# Patient Record
Sex: Male | Born: 1990 | Race: Black or African American | Hispanic: No | Marital: Single
Health system: Southern US, Community
[De-identification: ages and names within clinical notes are randomized; demographics above are authoritative.]

---

## 2019-04-28 ENCOUNTER — Other Ambulatory Visit (HOSPITAL_COMMUNITY): Payer: Self-pay | Admitting: Surgery

## 2019-04-28 ENCOUNTER — Emergency Department (HOSPITAL_COMMUNITY): Payer: Self-pay

## 2019-04-28 ENCOUNTER — Other Ambulatory Visit: Payer: Self-pay

## 2019-04-28 ENCOUNTER — Encounter (HOSPITAL_COMMUNITY): Payer: Self-pay | Admitting: Emergency Medicine

## 2019-04-28 ENCOUNTER — Emergency Department (HOSPITAL_COMMUNITY)
Admission: EM | Admit: 2019-04-28 | Discharge: 2019-04-28 | Disposition: A | Discharge status: Home / Self Care | Payer: Self-pay | Attending: Surgery | Admitting: Surgery

## 2019-04-28 DIAGNOSIS — Z20828 Contact with and (suspected) exposure to other viral communicable diseases: Secondary | ICD-10-CM | POA: Insufficient documentation

## 2019-04-28 DIAGNOSIS — K8020 Calculus of gallbladder without cholecystitis without obstruction: Secondary | ICD-10-CM | POA: Insufficient documentation

## 2019-04-28 DIAGNOSIS — K819 Cholecystitis, unspecified: Secondary | ICD-10-CM

## 2019-04-28 LAB — CBC
HCT: 46.4 % (ref 39.0–52.0)
Hemoglobin: 15.4 g/dL (ref 13.0–17.0)
MCH: 28.9 pg (ref 26.0–34.0)
MCHC: 33.2 g/dL (ref 30.0–36.0)
MCV: 87.1 fL (ref 80.0–100.0)
Platelets: 241 10*3/uL (ref 150–400)
RBC: 5.33 MIL/uL (ref 4.22–5.81)
RDW: 13.4 % (ref 11.5–15.5)
WBC: 6.5 10*3/uL (ref 4.0–10.5)
nRBC: 0 % (ref 0.0–0.2)

## 2019-04-28 LAB — COMPREHENSIVE METABOLIC PANEL
ALT: 20 U/L (ref 0–44)
AST: 19 U/L (ref 15–41)
Albumin: 4.3 g/dL (ref 3.5–5.0)
Alkaline Phosphatase: 50 U/L (ref 38–126)
Anion gap: 9 (ref 5–15)
BUN: 10 mg/dL (ref 6–20)
CO2: 29 mmol/L (ref 22–32)
Calcium: 10 mg/dL (ref 8.9–10.3)
Chloride: 99 mmol/L (ref 98–111)
Creatinine, Ser: 0.83 mg/dL (ref 0.61–1.24)
GFR calc Af Amer: >60 mL/min (ref >60)
GFR calc non Af Amer: >60 mL/min (ref >60)
Glucose, Bld: 104 mg/dL — ABNORMAL HIGH (ref 70–99)
Potassium: 4.1 mmol/L (ref 3.5–5.1)
Sodium: 137 mmol/L (ref 135–145)
Total Bilirubin: 0.8 mg/dL (ref 0.3–1.2)
Total Protein: 8.2 g/dL — ABNORMAL HIGH (ref 6.5–8.1)

## 2019-04-28 LAB — SARS CORONAVIRUS 2 BY RT PCR (HOSPITAL ORDER, PERFORMED IN ~~LOC~~ HOSPITAL LAB): SARS Coronavirus 2: NEGATIVE

## 2019-04-28 LAB — URINALYSIS, ROUTINE W REFLEX MICROSCOPIC
Bacteria, UA: NONE SEEN
Bilirubin Urine: NEGATIVE
Glucose, UA: NEGATIVE mg/dL
Hgb urine dipstick: NEGATIVE
Ketones, ur: 20 mg/dL — AB
Leukocytes,Ua: NEGATIVE
Nitrite: NEGATIVE
Protein, ur: 30 mg/dL — AB
Specific Gravity, Urine: 1.027 (ref 1.005–1.030)
pH: 5 (ref 5.0–8.0)

## 2019-04-28 LAB — LIPASE, BLOOD: Lipase: 23 U/L (ref 11–51)

## 2019-04-28 MED ORDER — SODIUM CHLORIDE 0.9 % IV SOLN
INTRAVENOUS | Status: DC
  Administered 2019-04-28: 20:00:00 via INTRAVENOUS

## 2019-04-28 MED ORDER — IOHEXOL 300 MG/ML  SOLN
100.0000 mL | Freq: Once | INTRAMUSCULAR | Status: AC | PRN
  Administered 2019-04-28: 100 mL via INTRAVENOUS

## 2019-04-28 MED ORDER — SODIUM CHLORIDE (PF) 0.9 % IJ SOLN
INTRAMUSCULAR | Status: AC
  Administered 2019-04-28: 20:00:00
  Filled 2019-04-28: qty 50

## 2019-04-28 MED ORDER — HYDROCODONE-ACETAMINOPHEN 5-325 MG PO TABS: 1.0000 | ORAL_TABLET | Freq: Four times a day (QID) | ORAL | 0 refills | Status: AC | PRN

## 2019-04-28 NOTE — ED Triage Notes (Signed)
Pt c/o RLQ pains since Sunday. Reports pains aren't as bad as they were then. Reports that pain is worse with movement.

## 2019-04-28 NOTE — Discharge Instructions (Signed)
If pain worsens or you start running a fever and cannot hold anything down please return to the emergency room

## 2019-04-28 NOTE — ED Notes (Signed)
Unsuccessful IV attempt x2.  

## 2019-04-28 NOTE — H&P (Signed)
Re:   Fiore Detjen DOB:   [birth year] MRN:   [id number]  Chief Complaint Abdominal pain  ASSESEMENT AND PLAN: 1.  Cholecystis, cholelithiasis  I discussed with the patient the indications and risks of gall bladder surgery.  The primary risks of gall bladder surgery include, but are not limited to, bleeding, infection, common bile duct injury, and open surgery.  There is also the risk that the patient may have continued symptoms after surgery.  We discussed the typical post-operative recovery course. I tried to answer the patient's questions.  His symptoms are mild, his WBC normal.  I got his mother on the phone.  I offered to keep him in the hospital with plans for gall bladder surgery tomorrow or to go home and contact our office about an elective cholecystectomy in 2 to 4 weeks.  His mother much preferred he go home tonight for an elective operation.  He'll contact our office tomorrow to schedule surgery.   Chief Complaint  Patient presents with  . Abdominal Pain  . sent from [facility]   PHYSICIAN REQUESTING CONSULTATION:  Dr. Deanna Artis, [facility]  HISTORY OF PRESENT ILLNESS: Anddy Wingert is a 28 y.o. (DOB: [birth year])  AA male whose primary care physician is Patient, No Pcp Per. He is by himself.   Mr. Roseboom presents with a 4 day history of RUQ pain. This pain started Sunday, 9/27.  He had a lot of RUQ pain and vomited.  It has gotten better during the week, but still bothers him some.  He has been afebrile and had no more vomiting.  He has no history of stomach disease, liver disease, or colon disease.  He has had no prior surgery.  His mother's mother had gall bladder disease.  CT scan of the abdomen - 04/28/2019 - 1. Diffuse gallbladder wall thickening and edema as well as small amount of free fluid within the pelvis. These findings are likely related to underlying liver or systemic disease. Clinical correlation is recommended. No calcified gallstone identified. Ultrasound may  provide better evaluation of the gallbladder.  2. No bowel obstruction or active inflammation. Normal appendix.  US of the abdomen - 04/28/2019 - Cholelithiasis with equivocal findings for acute cholecystitis.  He has multiple large stones in the gall bladder.  WBC - 6,500 - 04/28/2019 LFT's okay - 04/28/2019   History reviewed. No pertinent past medical history.   History reviewed. No pertinent surgical history.    Current Facility-Administered Medications  Medication Dose Route Frequency Provider Last Rate Last Dose  . 0.9 %  sodium chloride infusion   Intravenous Continuous Lorre Nick, MD 20 mL/hr at 04/28/19 2025     Current Outpatient Medications  Medication Sig Dispense Refill  . acetaminophen (TYLENOL) 325 MG tablet Take 650 mg by mouth daily as needed for moderate pain.       No Known Allergies  REVIEW OF SYSTEMS: Skin:  No history of rash.  No history of abnormal moles. Infection:  No history of hepatitis or HIV.  No history of MRSA. Neurologic:  No history of stroke.  No history of seizure.  No history of headaches. Cardiac:  No history of hypertension. No history of heart disease.  No history of prior cardiac catheterization.  No history of seeing a cardiologist. Pulmonary:  Does not smoke cigarettes.  No asthma or bronchitis.  No OSA/CPAP.  Endocrine:  No diabetes. No thyroid disease. Gastrointestinal:   See HPI Urologic:  No history of kidney stones.  No history of  bladder infections. Musculoskeletal:  No history of joint or back disease. Hematologic:  No bleeding disorder.  No history of anemia.  Not anticoagulated. Psycho-social:  The patient is oriented.   The patient has no obvious psychologic or social impairment to understanding our conversation and plan.  SOCIAL and FAMILY HISTORY: Unmarried. Lives at home with his parents.  I spoke to his mother, Levis Nazir, on the phone.      He is unemplyed for 2 years.  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  His grandmother had gall bladder disease.  PHYSICAL EXAM: BP 108/78   Pulse 99   Temp 98.4 F (36.9 C) (Oral)   Resp 17   SpO2 100%   General: WN AA M who is alert and generally healthy appearing.  Skin:  Inspection and palpation - no mass or rash. Eyes:  Conjunctiva and lids unremarkable.            Pupils are equal Ears, Nose, Mouth, and Throat:  Ears and nose unremarkable            Lips and teeth are unremarable. Neck: Supple. No mass, trachea midline.  No thyroid mass. Lymph Nodes:  No supraclavicular, cervical, or inguinal nodes. Lungs: Normal respiratory effort.  Clear to auscultation and symmetric breath sounds. Heart:  Palpation of the heart is normal.            Auscultation: RRR. No murmur or rub.  Abdomen: Soft. No mass.  No hernia.  Normal bowel sounds.  No abdominal scars.   Minimal RUQ soreness.  No guarding or peritoneal signs. Rectal: Not done. Musculoskeletal:  Good muscle strength and ROM  in upper and lower extremities.  Neurologic:  Grossly intact to motor and sensory function. Psychiatric: Normal judgement and insight. Behavior is normal.            Oriented to time, person, place.   DATA REVIEWED, COUNSELING AND COORDINATION OF CARE: Epic notes reviewed. Counseling and coordination of care exceeded more than 50% of the time spent with patient. Total time spent with patient and charting: 45 minutes  Alphonsa Overall, MD,  [facility], [address]  [place], [place]    [place] Phone:  [phone] FAX:  [phone]

## 2019-04-28 NOTE — ED Provider Notes (Signed)
[facility]-EMERGENCY DEPT Provider Note   CSN: [id number] Arrival date & time: 04/28/19  1351     History   Chief Complaint Chief Complaint  Patient presents with  . Abdominal Pain  . sent from [facility]    HPI Javyn Havlin is a 28 y.o. male.     28 year old male presents with almost a week of right upper quadrant abdominal pain.  No associated fever or chills.  No urinary symptoms.  Denies any testicular pain or swelling.  Went to urgent care center and was sent here for evaluation of possible pollicized.  He describes the pain is constant and achy.  No association with taking deep breath.  No cough or shortness of breath.  No associated with food either.  No prior history of same.  No treatment use prior to arrival.     History reviewed. No pertinent past medical history.  There are no active problems to display for this patient.   History reviewed. No pertinent surgical history.      Home Medications    Prior to Admission medications   Not on File    Family History No family history on file.  Social History Social History   Tobacco Use  . Smoking status: Not on file  Substance Use Topics  . Alcohol use: Not on file  . Drug use: Not on file     Allergies   Patient has no known allergies.   Review of Systems Review of Systems  All other systems reviewed and are negative.    Physical Exam Updated Vital Signs BP 121/82 (BP Location: Right Arm)   Pulse 82   Temp 98.4 F (36.9 C) (Oral)   Resp 17   SpO2 99%   Physical Exam Vitals signs and nursing note reviewed.  Constitutional:      General: He is not in acute distress.    Appearance: Normal appearance. He is well-developed. He is not toxic-appearing.  HENT:     Head: Normocephalic and atraumatic.  Eyes:     General: Lids are normal.     Conjunctiva/sclera: Conjunctivae normal.     Pupils: Pupils are equal, round, and reactive to light.  Neck:     Musculoskeletal:  Normal range of motion and neck supple.     Thyroid: No thyroid mass.     Trachea: No tracheal deviation.  Cardiovascular:     Rate and Rhythm: Normal rate and regular rhythm.     Heart sounds: Normal heart sounds. No murmur. No gallop.   Pulmonary:     Effort: Pulmonary effort is normal. No respiratory distress.     Breath sounds: Normal breath sounds. No stridor. No decreased breath sounds, wheezing, rhonchi or rales.  Abdominal:     General: Bowel sounds are normal. There is no distension.     Palpations: Abdomen is soft.     Tenderness: There is abdominal tenderness in the right upper quadrant. There is no guarding or rebound.    Musculoskeletal: Normal range of motion.        General: No tenderness.  Skin:    General: Skin is warm and dry.     Findings: No abrasion or rash.  Neurological:     Mental Status: He is alert and oriented to person, place, and time.     GCS: GCS eye subscore is 4. GCS verbal subscore is 5. GCS motor subscore is 6.     Cranial Nerves: No cranial nerve deficit.  Sensory: No sensory deficit.  Psychiatric:        Speech: Speech normal.        Behavior: Behavior normal.      ED Treatments / Results  Labs (all labs ordered are listed, but only abnormal results are displayed) Labs Reviewed  COMPREHENSIVE METABOLIC PANEL - Abnormal; Notable for the following components:      Result Value   Glucose, Bld 104 (*)    Total Protein 8.2 (*)    All other components within normal limits  LIPASE, BLOOD  CBC  URINALYSIS, ROUTINE W REFLEX MICROSCOPIC    EKG None  Radiology No results found.  Procedures Procedures (including critical care time)  Medications Ordered in ED Medications  0.9 %  sodium chloride infusion (has no administration in time range)     Initial Impression / Assessment and Plan / ED Course  I have reviewed the triage vital signs and the nursing notes.  Pertinent labs & imaging results that were available during my care  of the patient were reviewed by me and considered in my medical decision making (see chart for details).        Patient is abdominal CT suspicious for cholecystitis.  This was confirmed by ultrasound.  Discussed with Dr. Lucia Gaskins from general surgery will come and see the patient  Final Clinical Impressions(s) / ED Diagnoses   Final diagnoses:  None    ED Discharge Orders    None       Lacretia Leigh, MD 04/28/19 2155

## 2021-03-19 IMAGING — US US ABDOMEN LIMITED
1 series · 14 of 25 positions shown · non-contrast
Comparison: CT of the abdomen pelvis dated 04/28/2019

CLINICAL DATA: 27-year-old male with right upper quadrant abdominal
pain.

EXAM:
ULTRASOUND ABDOMEN LIMITED RIGHT UPPER QUADRANT

[Series 1: us abdomen limited · 14 of 53 slices shown]
[im 1/53]
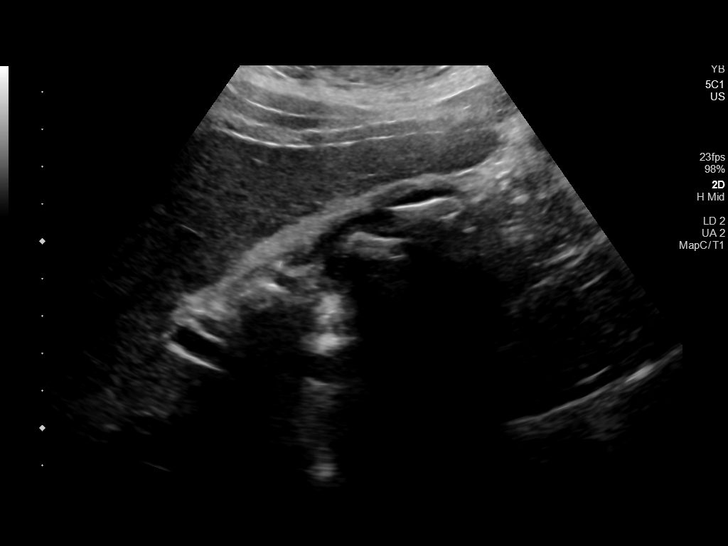
[im 5/53]
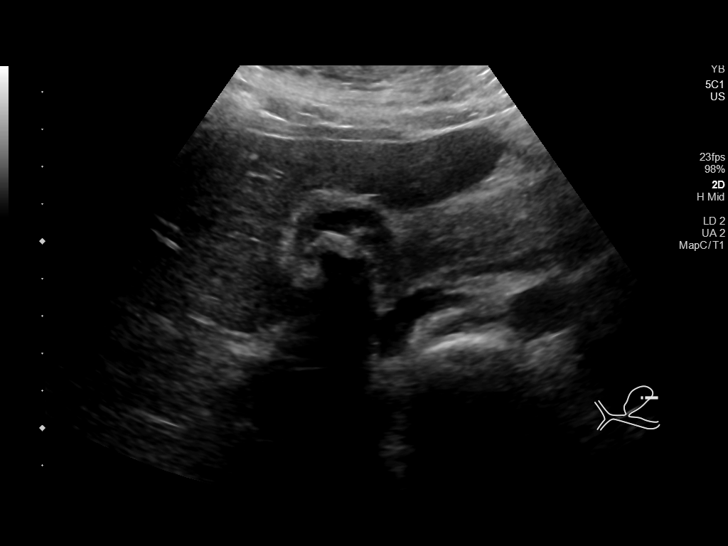
[im 9/53]
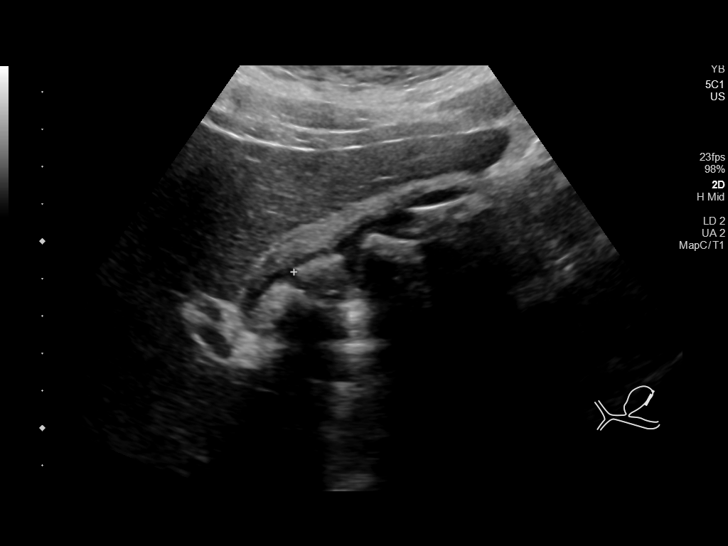
[im 14/53]
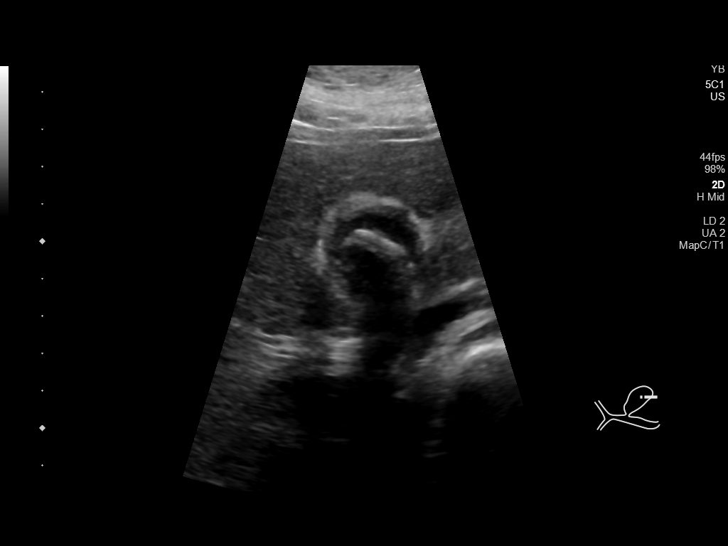
[im 18/53]
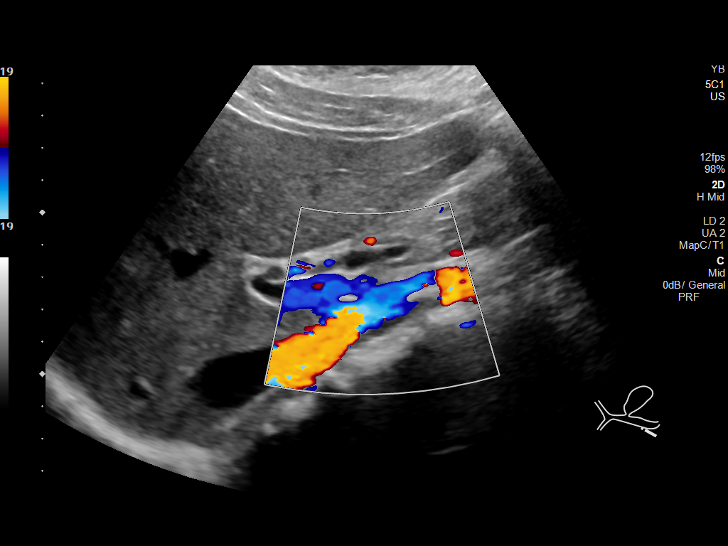
[im 20/53]
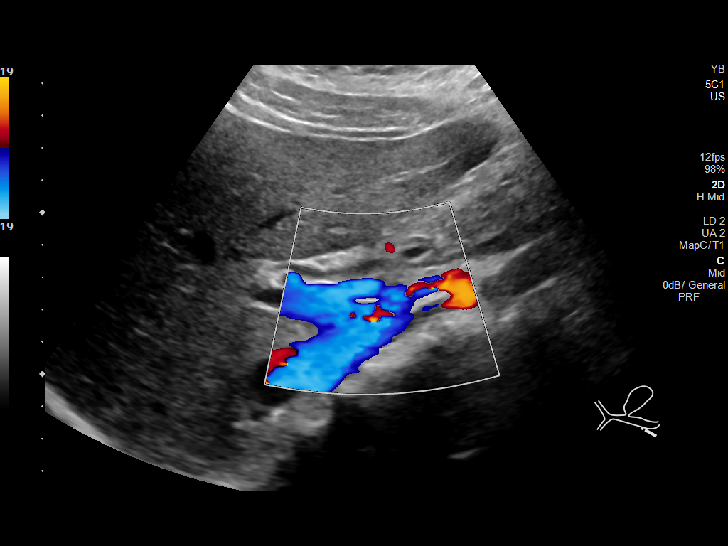
[im 24/53]
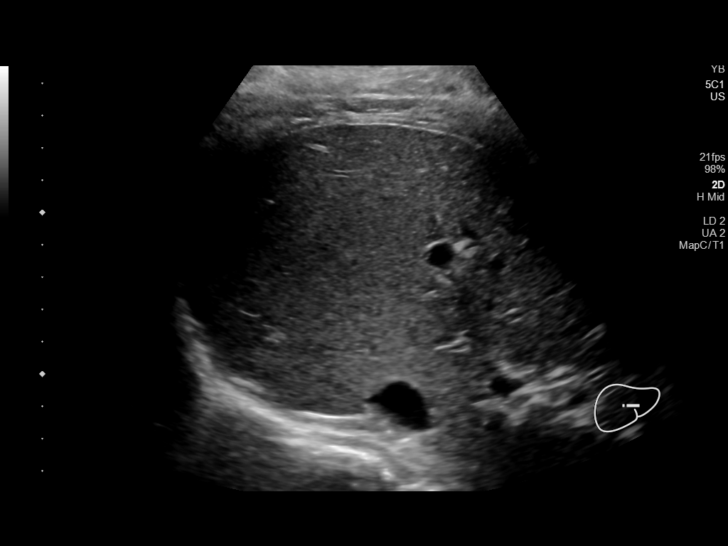
[im 29/53]
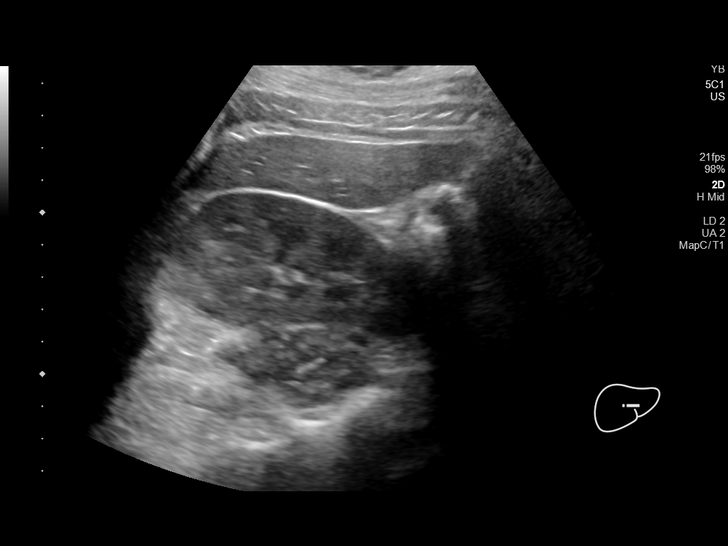
[im 33/53]
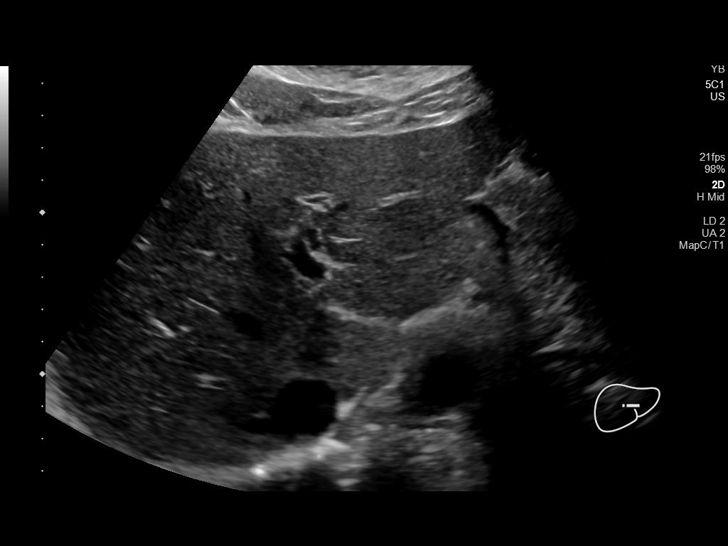
[im 35/53]
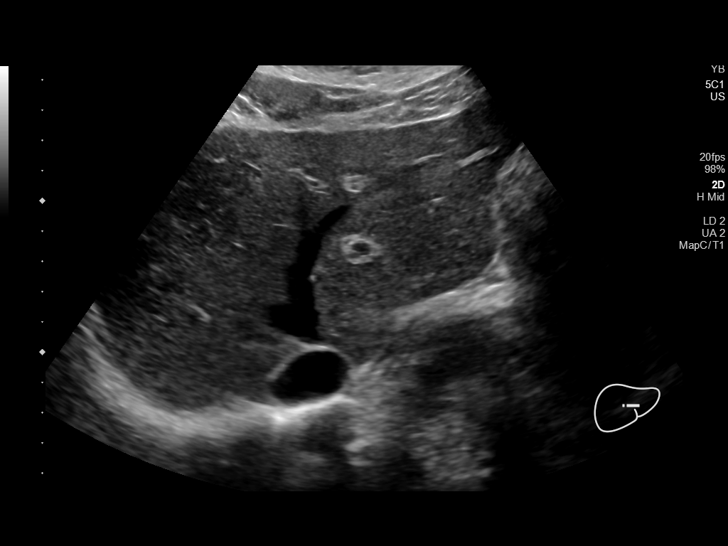
[im 40/53]
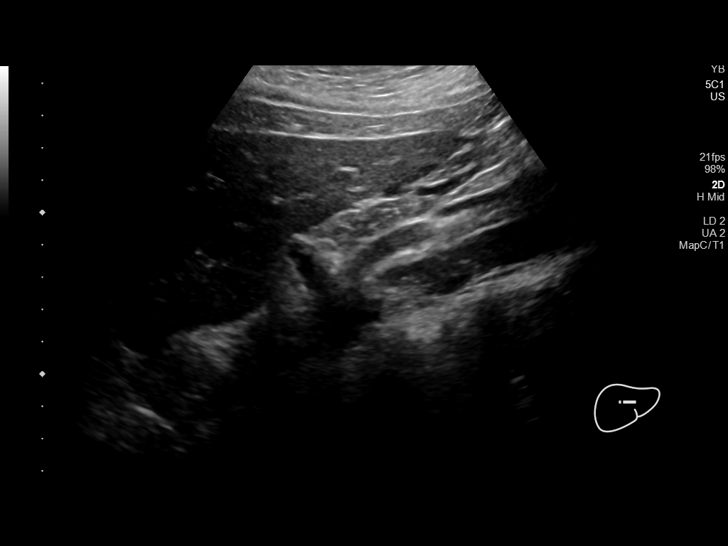
[im 44/53]
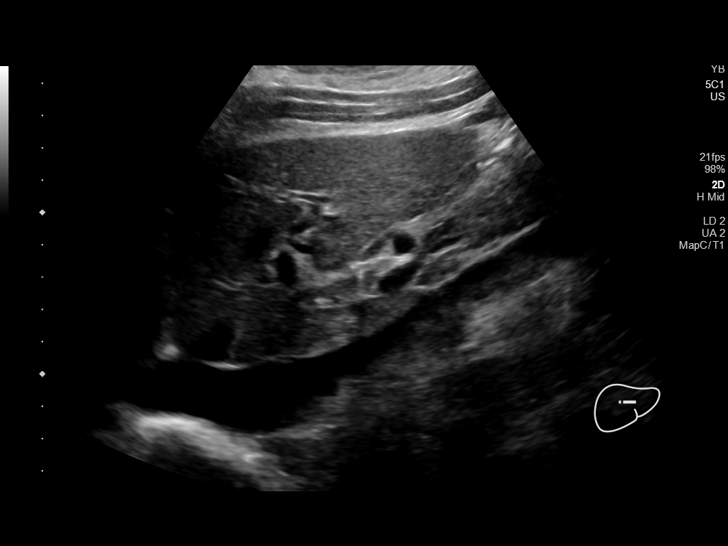
[im 48/53]
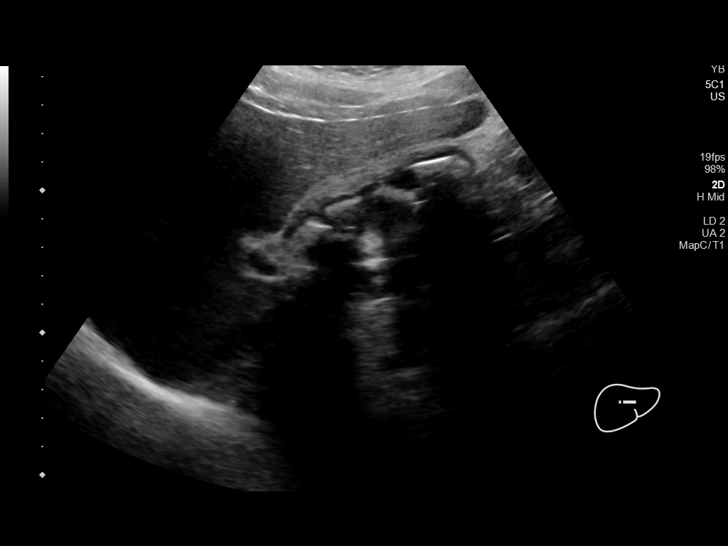
[im 53/53]
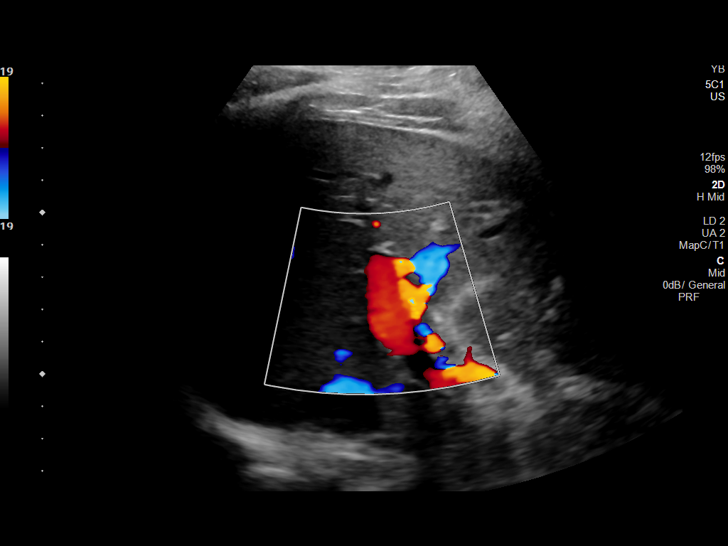

[14 of 25 positions shown; findings below may reference images not displayed]

FINDINGS: Gallbladder:

There are multiple stones within the gallbladder. There is diffuse
thickening of the gallbladder wall measuring up to 5 mm. Small
pericholecystic fluid noted. Negative sonographic Murphy's sign.

Common bile duct:

Diameter: 7 mm

Liver:

The liver is unremarkable as visualized. Portal vein is patent on
color Doppler imaging with normal direction of blood flow towards
the liver.

Other: None.
IMPRESSION: Cholelithiasis with equivocal findings for acute cholecystitis. A
hepatobiliary scintigraphy may provide better evaluation of the
gallbladder if there is a high clinical concern for acute
cholecystitis .

## 2021-03-19 IMAGING — CT CT ABD-PELV W/ CM
2 of 4 series · 16 of 46 positions shown, 18 images · IV contrast (omnipaque)
Comparison: None.

CLINICAL DATA: 27-year-old male with right lower quadrant abdominal
pain.

EXAM:
CT ABDOMEN AND PELVIS WITH CONTRAST
TECHNIQUE: Multidetector CT imaging of the abdomen and pelvis was performed
using the standard protocol following bolus administration of
intravenous contrast.
CONTRAST:  100mL OMNIPAQUE IOHEXOL 300 MG/ML  SOLN

[Series 2: axial st · axial · 0.70mm/px · z∈[-235,+135]mm · 13 of 84 slices shown, 15 images]
[im 5/84  soft-tissue]
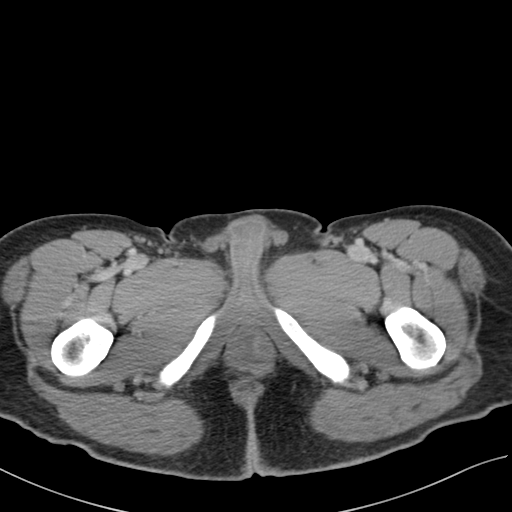
[im 5/84  bone]
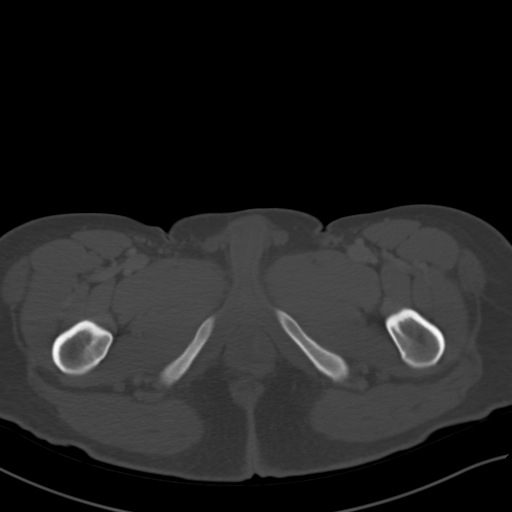
[im 10/84  soft-tissue]
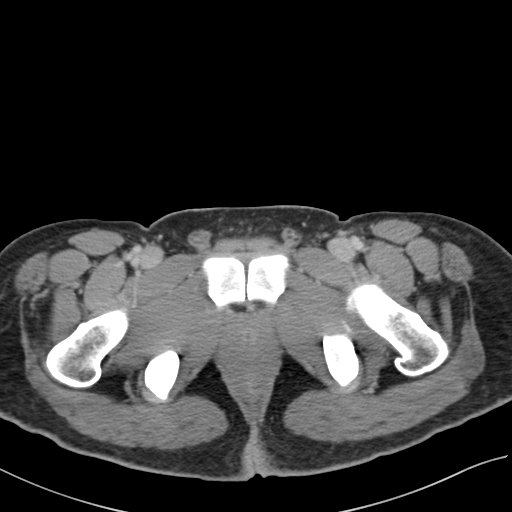
[im 20/84  soft-tissue]
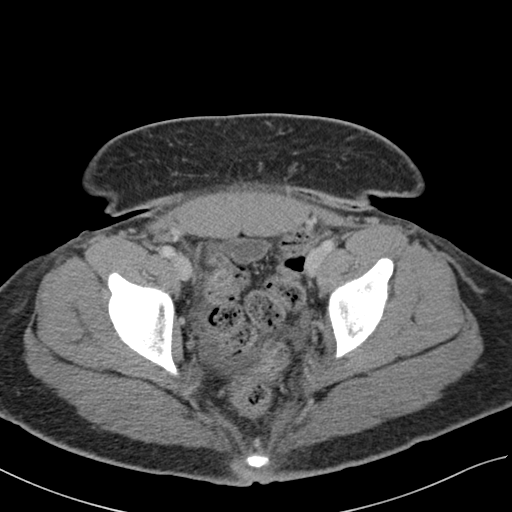
[im 25/84  soft-tissue]
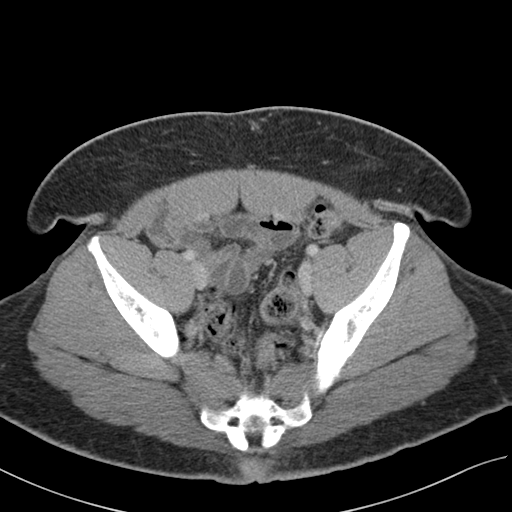
[im 30/84  soft-tissue]
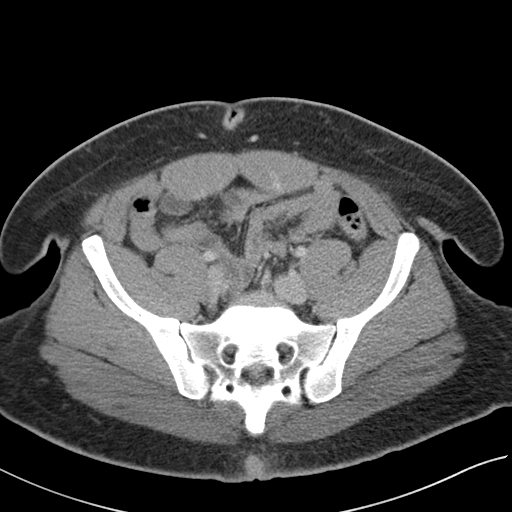
[im 35/84  soft-tissue]
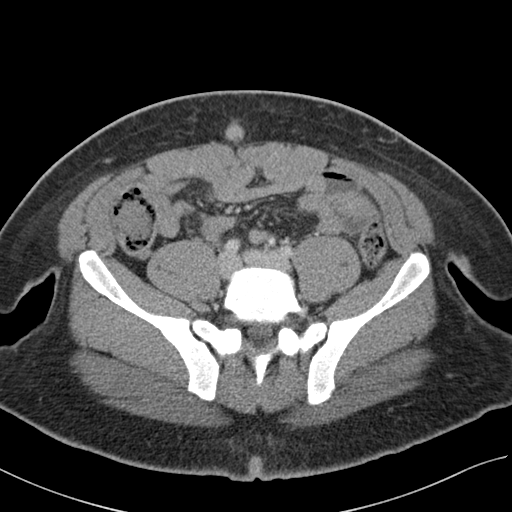
[im 44/84  soft-tissue]
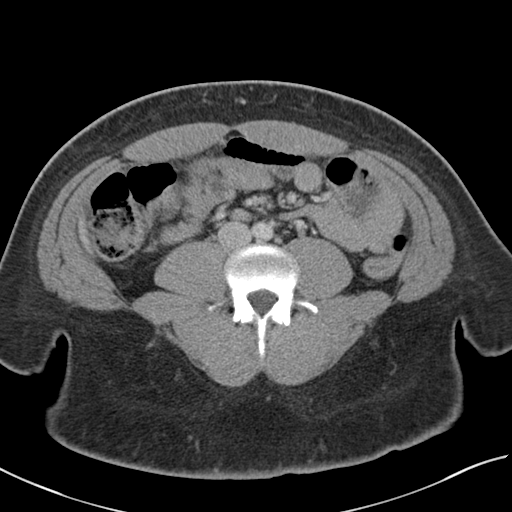
[im 49/84  soft-tissue]
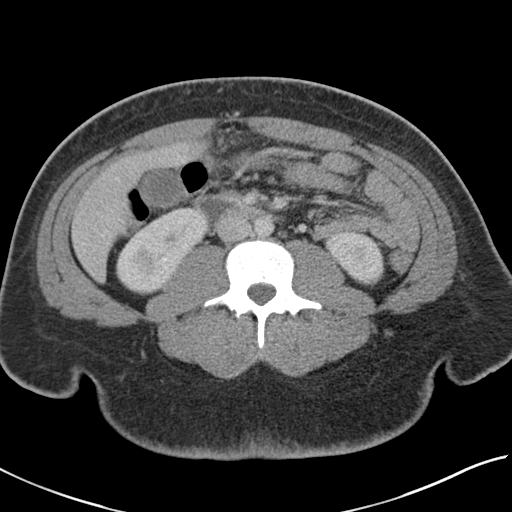
[im 54/84  soft-tissue]
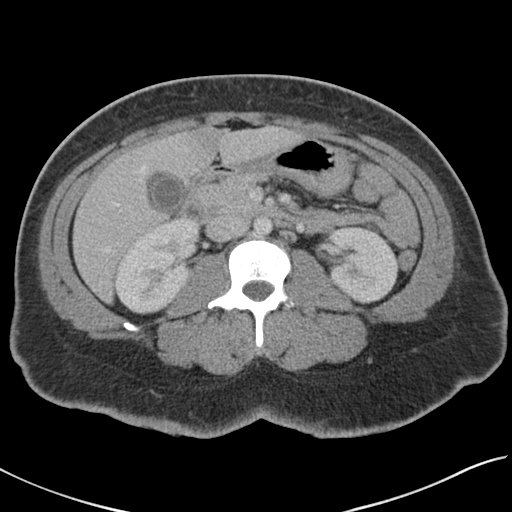
[im 54/84  bone]
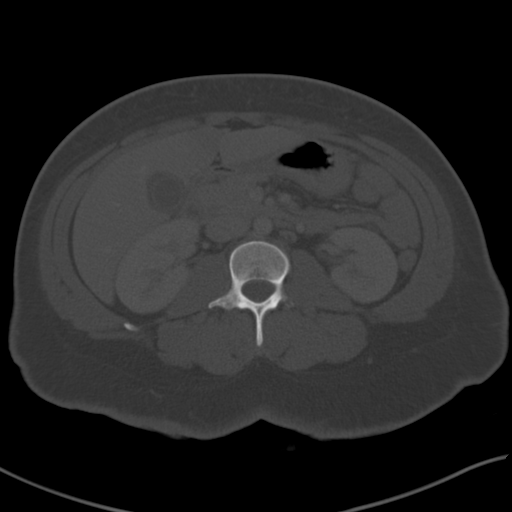
[im 59/84  soft-tissue]
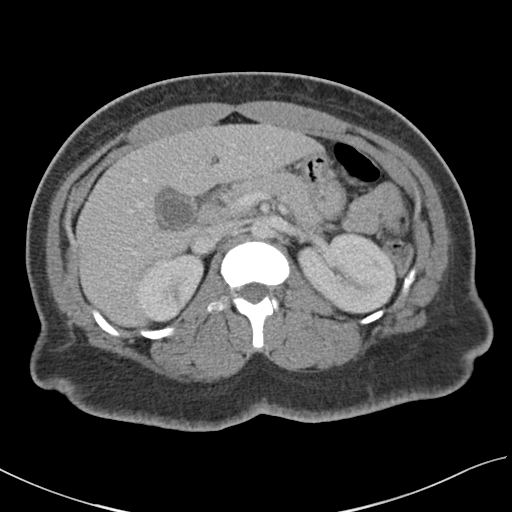
[im 64/84  soft-tissue]
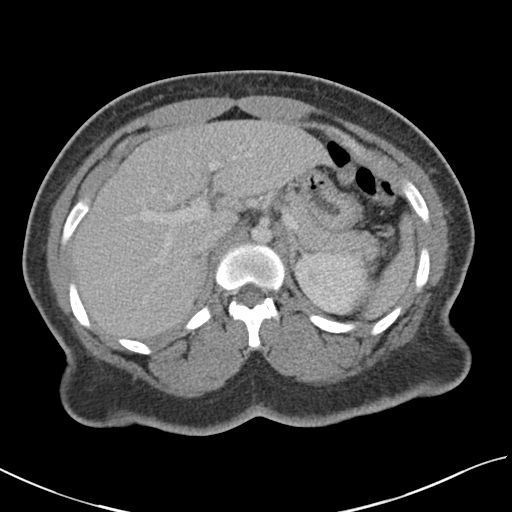
[im 74/84  soft-tissue]
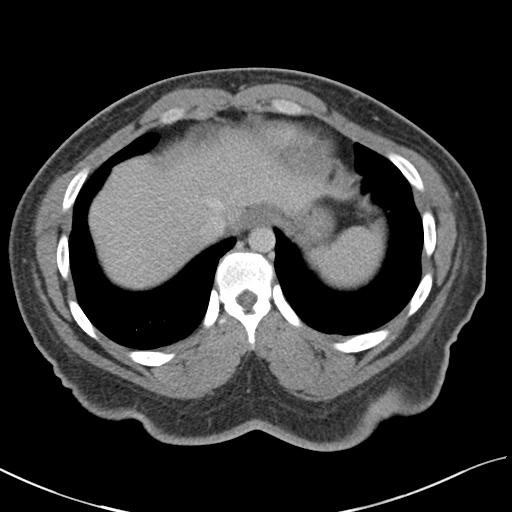
[im 79/84  soft-tissue]
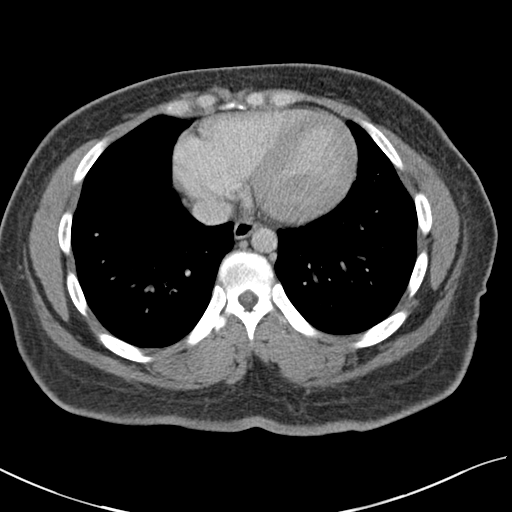

[Series 5: coronal st · coronal · 0.63mm/px · 3 of 126 slices shown]
[im 42/126  soft-tissue]
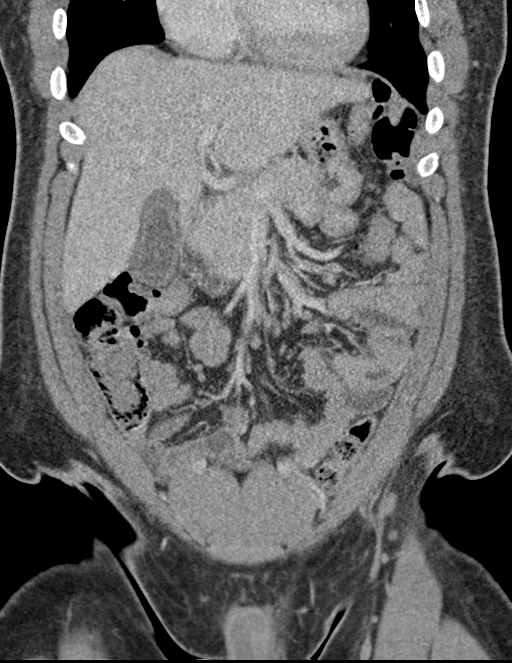
[im 56/126  soft-tissue]
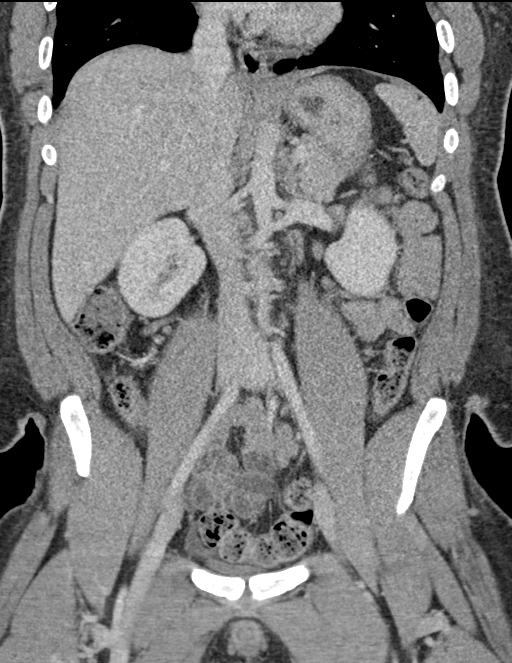
[im 70/126  soft-tissue]
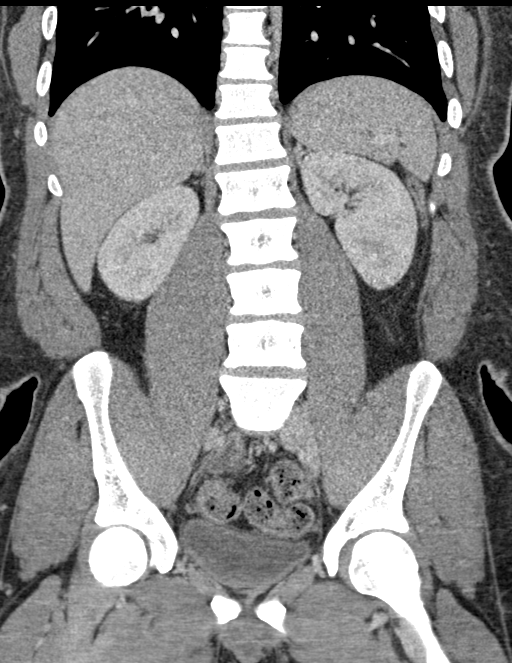

[16 of 46 positions shown; findings below may reference images not displayed]

FINDINGS: Lower chest: The visualized lung bases are clear.

No intra-abdominal free air. Small free fluid within pelvis.

Hepatobiliary: The liver is unremarkable. No intrahepatic biliary
ductal dilatation. There is diffuse gallbladder wall thickening and
edema. No calcified gallstone identified. The gallbladder wall edema
may be related to underlying liver disease. Clinical correlation is
recommended. Ultrasound may provide better evaluation of the
gallbladder.

Pancreas: Unremarkable. No pancreatic ductal dilatation or
surrounding inflammatory changes.

Spleen: Normal in size without focal abnormality.

Adrenals/Urinary Tract: Adrenal glands are unremarkable. Kidneys are
normal, without renal calculi, focal lesion, or hydronephrosis.
Bladder is unremarkable.

Stomach/Bowel: There is no bowel obstruction or active inflammation.
The appendix is normal.

Vascular/Lymphatic: The abdominal aorta and IVC are unremarkable. No
portal venous gas. There is no adenopathy.

Reproductive: The prostate and seminal vesicles are grossly
unremarkable. No mass.

Other: Mild diffuse subcutaneous edema. No fluid collection.

Musculoskeletal: No acute or significant osseous findings.
IMPRESSION: 1. Diffuse gallbladder wall thickening and edema as well as small
amount of free fluid within the pelvis. These findings are likely
related to underlying liver or systemic disease. Clinical
correlation is recommended. No calcified gallstone identified.
Ultrasound may provide better evaluation of the gallbladder.
2. No bowel obstruction or active inflammation. Normal appendix.
